# Patient Record
Sex: Female | Born: 1980 | Hispanic: No | Marital: Single | Smoking: Former smoker
Health system: Southern US, Community
[De-identification: ages and names within clinical notes are randomized; demographics above are authoritative.]

## PROBLEM LIST (undated history)

## (undated) ENCOUNTER — Inpatient Hospital Stay (HOSPITAL_COMMUNITY): Payer: Self-pay

## (undated) DIAGNOSIS — R87629 Unspecified abnormal cytological findings in specimens from vagina: Secondary | ICD-10-CM

## (undated) DIAGNOSIS — N83209 Unspecified ovarian cyst, unspecified side: Secondary | ICD-10-CM

---

## 2015-11-20 HISTORY — PX: ANTERIOR CRUCIATE LIGAMENT REPAIR: SHX115

## 2016-11-21 DIAGNOSIS — M25561 Pain in right knee: Secondary | ICD-10-CM | POA: Diagnosis not present

## 2016-11-21 DIAGNOSIS — R262 Difficulty in walking, not elsewhere classified: Secondary | ICD-10-CM | POA: Diagnosis not present

## 2016-11-21 DIAGNOSIS — S83511D Sprain of anterior cruciate ligament of right knee, subsequent encounter: Secondary | ICD-10-CM | POA: Diagnosis not present

## 2016-11-21 DIAGNOSIS — Z4789 Encounter for other orthopedic aftercare: Secondary | ICD-10-CM | POA: Diagnosis not present

## 2016-11-28 DIAGNOSIS — M25561 Pain in right knee: Secondary | ICD-10-CM | POA: Diagnosis not present

## 2016-11-28 DIAGNOSIS — R262 Difficulty in walking, not elsewhere classified: Secondary | ICD-10-CM | POA: Diagnosis not present

## 2016-11-28 DIAGNOSIS — S83511D Sprain of anterior cruciate ligament of right knee, subsequent encounter: Secondary | ICD-10-CM | POA: Diagnosis not present

## 2016-11-28 DIAGNOSIS — Z4789 Encounter for other orthopedic aftercare: Secondary | ICD-10-CM | POA: Diagnosis not present

## 2016-12-14 DIAGNOSIS — S83511D Sprain of anterior cruciate ligament of right knee, subsequent encounter: Secondary | ICD-10-CM | POA: Diagnosis not present

## 2016-12-14 DIAGNOSIS — Z4789 Encounter for other orthopedic aftercare: Secondary | ICD-10-CM | POA: Diagnosis not present

## 2016-12-14 DIAGNOSIS — M25561 Pain in right knee: Secondary | ICD-10-CM | POA: Diagnosis not present

## 2016-12-14 DIAGNOSIS — R262 Difficulty in walking, not elsewhere classified: Secondary | ICD-10-CM | POA: Diagnosis not present

## 2017-01-02 DIAGNOSIS — S83511D Sprain of anterior cruciate ligament of right knee, subsequent encounter: Secondary | ICD-10-CM | POA: Diagnosis not present

## 2017-01-02 DIAGNOSIS — Z4789 Encounter for other orthopedic aftercare: Secondary | ICD-10-CM | POA: Diagnosis not present

## 2017-02-08 DIAGNOSIS — Z3201 Encounter for pregnancy test, result positive: Secondary | ICD-10-CM | POA: Diagnosis not present

## 2017-02-11 DIAGNOSIS — Z4789 Encounter for other orthopedic aftercare: Secondary | ICD-10-CM | POA: Diagnosis not present

## 2017-02-11 DIAGNOSIS — S83511D Sprain of anterior cruciate ligament of right knee, subsequent encounter: Secondary | ICD-10-CM | POA: Diagnosis not present

## 2017-02-27 DIAGNOSIS — Z3201 Encounter for pregnancy test, result positive: Secondary | ICD-10-CM | POA: Diagnosis not present

## 2017-02-27 DIAGNOSIS — Z6826 Body mass index (BMI) 26.0-26.9, adult: Secondary | ICD-10-CM | POA: Diagnosis not present

## 2017-03-05 DIAGNOSIS — Z3689 Encounter for other specified antenatal screening: Secondary | ICD-10-CM | POA: Diagnosis not present

## 2017-03-05 DIAGNOSIS — Z3A09 9 weeks gestation of pregnancy: Secondary | ICD-10-CM | POA: Diagnosis not present

## 2017-03-05 DIAGNOSIS — O09521 Supervision of elderly multigravida, first trimester: Secondary | ICD-10-CM | POA: Diagnosis not present

## 2017-03-22 DIAGNOSIS — R87610 Atypical squamous cells of undetermined significance on cytologic smear of cervix (ASC-US): Secondary | ICD-10-CM | POA: Diagnosis not present

## 2017-03-22 DIAGNOSIS — Z113 Encounter for screening for infections with a predominantly sexual mode of transmission: Secondary | ICD-10-CM | POA: Diagnosis not present

## 2017-03-22 DIAGNOSIS — O09521 Supervision of elderly multigravida, first trimester: Secondary | ICD-10-CM | POA: Diagnosis not present

## 2017-03-22 DIAGNOSIS — Z3A11 11 weeks gestation of pregnancy: Secondary | ICD-10-CM | POA: Diagnosis not present

## 2017-04-19 DIAGNOSIS — Z3A15 15 weeks gestation of pregnancy: Secondary | ICD-10-CM | POA: Diagnosis not present

## 2017-04-19 DIAGNOSIS — O09522 Supervision of elderly multigravida, second trimester: Secondary | ICD-10-CM | POA: Diagnosis not present

## 2017-04-19 DIAGNOSIS — R87610 Atypical squamous cells of undetermined significance on cytologic smear of cervix (ASC-US): Secondary | ICD-10-CM | POA: Diagnosis not present

## 2017-05-14 DIAGNOSIS — Z3A19 19 weeks gestation of pregnancy: Secondary | ICD-10-CM | POA: Diagnosis not present

## 2017-05-14 DIAGNOSIS — O09522 Supervision of elderly multigravida, second trimester: Secondary | ICD-10-CM | POA: Diagnosis not present

## 2017-05-31 DIAGNOSIS — O925 Suppressed lactation: Secondary | ICD-10-CM | POA: Diagnosis not present

## 2017-06-13 DIAGNOSIS — O09522 Supervision of elderly multigravida, second trimester: Secondary | ICD-10-CM | POA: Diagnosis not present

## 2017-06-13 DIAGNOSIS — Z3A23 23 weeks gestation of pregnancy: Secondary | ICD-10-CM | POA: Diagnosis not present

## 2017-07-12 DIAGNOSIS — Z3689 Encounter for other specified antenatal screening: Secondary | ICD-10-CM | POA: Diagnosis not present

## 2017-07-12 DIAGNOSIS — Z3A27 27 weeks gestation of pregnancy: Secondary | ICD-10-CM | POA: Diagnosis not present

## 2017-07-12 DIAGNOSIS — O09522 Supervision of elderly multigravida, second trimester: Secondary | ICD-10-CM | POA: Diagnosis not present

## 2017-07-17 DIAGNOSIS — Z3689 Encounter for other specified antenatal screening: Secondary | ICD-10-CM | POA: Diagnosis not present

## 2017-07-17 DIAGNOSIS — Z3A28 28 weeks gestation of pregnancy: Secondary | ICD-10-CM | POA: Diagnosis not present

## 2017-07-17 DIAGNOSIS — O99810 Abnormal glucose complicating pregnancy: Secondary | ICD-10-CM | POA: Diagnosis not present

## 2017-07-30 DIAGNOSIS — Z23 Encounter for immunization: Secondary | ICD-10-CM | POA: Diagnosis not present

## 2017-07-30 DIAGNOSIS — O09523 Supervision of elderly multigravida, third trimester: Secondary | ICD-10-CM | POA: Diagnosis not present

## 2017-07-30 DIAGNOSIS — Z3A30 30 weeks gestation of pregnancy: Secondary | ICD-10-CM | POA: Diagnosis not present

## 2017-08-13 DIAGNOSIS — O09523 Supervision of elderly multigravida, third trimester: Secondary | ICD-10-CM | POA: Diagnosis not present

## 2017-08-13 DIAGNOSIS — Z3A32 32 weeks gestation of pregnancy: Secondary | ICD-10-CM | POA: Diagnosis not present

## 2017-08-20 ENCOUNTER — Other Ambulatory Visit: Payer: Self-pay | Admitting: Obstetrics and Gynecology

## 2017-08-30 DIAGNOSIS — O09523 Supervision of elderly multigravida, third trimester: Secondary | ICD-10-CM | POA: Diagnosis not present

## 2017-08-30 DIAGNOSIS — Z23 Encounter for immunization: Secondary | ICD-10-CM | POA: Diagnosis not present

## 2017-08-30 DIAGNOSIS — Z3A34 34 weeks gestation of pregnancy: Secondary | ICD-10-CM | POA: Diagnosis not present

## 2017-09-03 ENCOUNTER — Inpatient Hospital Stay (HOSPITAL_COMMUNITY)
Admission: AD | Admit: 2017-09-03 | Discharge: 2017-09-03 | Disposition: A | Discharge status: Home / Self Care | Payer: BLUE CROSS/BLUE SHIELD | Source: Ambulatory Visit | Attending: Obstetrics and Gynecology | Admitting: Obstetrics and Gynecology

## 2017-09-03 ENCOUNTER — Inpatient Hospital Stay (HOSPITAL_COMMUNITY): Payer: BLUE CROSS/BLUE SHIELD

## 2017-09-03 ENCOUNTER — Encounter (HOSPITAL_COMMUNITY): Payer: Self-pay | Admitting: *Deleted

## 2017-09-03 DIAGNOSIS — O26893 Other specified pregnancy related conditions, third trimester: Secondary | ICD-10-CM | POA: Insufficient documentation

## 2017-09-03 DIAGNOSIS — O368130 Decreased fetal movements, third trimester, not applicable or unspecified: Secondary | ICD-10-CM | POA: Diagnosis not present

## 2017-09-03 DIAGNOSIS — O4693 Antepartum hemorrhage, unspecified, third trimester: Secondary | ICD-10-CM | POA: Diagnosis present

## 2017-09-03 DIAGNOSIS — Z3A35 35 weeks gestation of pregnancy: Secondary | ICD-10-CM | POA: Diagnosis not present

## 2017-09-03 DIAGNOSIS — R030 Elevated blood-pressure reading, without diagnosis of hypertension: Secondary | ICD-10-CM | POA: Diagnosis not present

## 2017-09-03 DIAGNOSIS — O34211 Maternal care for low transverse scar from previous cesarean delivery: Secondary | ICD-10-CM | POA: Insufficient documentation

## 2017-09-03 DIAGNOSIS — O4703 False labor before 37 completed weeks of gestation, third trimester: Secondary | ICD-10-CM

## 2017-09-03 DIAGNOSIS — O09523 Supervision of elderly multigravida, third trimester: Secondary | ICD-10-CM | POA: Diagnosis not present

## 2017-09-03 DIAGNOSIS — Z87891 Personal history of nicotine dependence: Secondary | ICD-10-CM | POA: Diagnosis not present

## 2017-09-03 DIAGNOSIS — O34219 Maternal care for unspecified type scar from previous cesarean delivery: Secondary | ICD-10-CM | POA: Diagnosis present

## 2017-09-03 HISTORY — DX: Unspecified abnormal cytological findings in specimens from vagina: R87.629

## 2017-09-03 HISTORY — DX: Unspecified ovarian cyst, unspecified side: N83.209

## 2017-09-03 LAB — COMPREHENSIVE METABOLIC PANEL
ALK PHOS: 107 U/L (ref 38–126)
ALT: 18 U/L (ref 14–54)
AST: 23 U/L (ref 15–41)
Albumin: 3 g/dL — ABNORMAL LOW (ref 3.5–5.0)
Anion gap: 8 (ref 5–15)
BILIRUBIN TOTAL: 0.4 mg/dL (ref 0.3–1.2)
BUN: 7 mg/dL (ref 6–20)
CO2: 23 mmol/L (ref 22–32)
CREATININE: 0.61 mg/dL (ref 0.44–1.00)
Calcium: 8.9 mg/dL (ref 8.9–10.3)
Chloride: 105 mmol/L (ref 101–111)
GFR calc Af Amer: >60 mL/min (ref >60)
Glucose, Bld: 94 mg/dL (ref 65–99)
Potassium: 3.9 mmol/L (ref 3.5–5.1)
Sodium: 136 mmol/L (ref 135–145)
TOTAL PROTEIN: 6.7 g/dL (ref 6.5–8.1)

## 2017-09-03 LAB — URINALYSIS, ROUTINE W REFLEX MICROSCOPIC
Bilirubin Urine: NEGATIVE
GLUCOSE, UA: NEGATIVE mg/dL
Ketones, ur: NEGATIVE mg/dL
Leukocytes, UA: NEGATIVE
Nitrite: NEGATIVE
Protein, ur: NEGATIVE mg/dL
SPECIFIC GRAVITY, URINE: 1.004 — AB (ref 1.005–1.030)
pH: 7 (ref 5.0–8.0)

## 2017-09-03 LAB — CBC
HEMATOCRIT: 36.6 % (ref 36.0–46.0)
Hemoglobin: 12.6 g/dL (ref 12.0–15.0)
MCH: 31.7 pg (ref 26.0–34.0)
MCHC: 34.4 g/dL (ref 30.0–36.0)
MCV: 92.2 fL (ref 78.0–100.0)
Platelets: 190 10*3/uL (ref 150–400)
RBC: 3.97 MIL/uL (ref 3.87–5.11)
RDW: 13 % (ref 11.5–15.5)
WBC: 9.8 10*3/uL (ref 4.0–10.5)

## 2017-09-03 LAB — PROTEIN / CREATININE RATIO, URINE: CREATININE, URINE: 32 mg/dL

## 2017-09-03 LAB — URIC ACID: URIC ACID, SERUM: 4 mg/dL (ref 2.3–6.6)

## 2017-09-03 MED ORDER — BETAMETHASONE SOD PHOS & ACET 6 (3-3) MG/ML IJ SUSP
12.0000 mg | Freq: Once | INTRAMUSCULAR | Status: AC
  Administered 2017-09-03: 12 mg via INTRAMUSCULAR
  Filled 2017-09-03: qty 2

## 2017-09-03 NOTE — Discharge Instructions (Signed)
Labor precautions, call if increased vaginal bleeding, decreased fetal movement, rupture of membrane

## 2017-09-03 NOTE — MAU Note (Signed)
Saw some blood when used the restroom this morning. Denies any pain with urination.  Last had intercourse around midnight.

## 2017-09-03 NOTE — MAU Note (Signed)
BP elevated, states is very anxious

## 2017-09-03 NOTE — MAU Note (Signed)
A. Kennadi Albany, RN assume care of patient.  

## 2017-09-03 NOTE — MAU Provider Note (Signed)
History     CSN: [id number]  Arrival date and time: 09/03/17 4098   First Provider Initiated Contact with Patient 09/03/17 [phone]      Chief Complaint  Patient presents with  . Vaginal Bleeding   HPI  Jennifer Barrera is a G2P1 at 35+1 weeks presenting today for vaginal bleeding in third trimester, cramping, and decreased fetal movement.  She called the on call midwife at 6:20am with concerns and was advised to come to MAU.  She reports intercourse around midnight and woke up at 6am with brown, dark blood with wiping.  She states since the one incident, she has not seen any more blood.  She denies LOF, passing clots, or abnormal discharge.  She also reports having intermittent BH ctxs since yesterday, but has noticed an increase in cramping this morning.  She reports her baby usually moves during the late morning, and afternoon and has not felt her move as much, but does report a few rolls.  She is a prior LTCS for arrest of dilation and NRFHR.  She is scheduled for a repeat LTCS on 10/01/17.  On arrival to MAU, she was found to have serial elevated BPs.  She denies HA, visual changes, or epigastric pain.   OB History    Gravida Para Term Preterm AB Living   0 0 1   SAB TAB Ectopic Multiple Live Births   0 0 0 0 1      Past Medical History:  Diagnosis Date  . Ovarian cyst   . Vaginal Pap smear, abnormal    bx, ok since    Past Surgical History:  Procedure Laterality Date  . ANTERIOR CRUCIATE LIGAMENT REPAIR Right 2017  . CESAREAN SECTION      Family History  Problem Relation Age of Onset  . Alzheimer's disease Maternal Grandmother   . Cancer Paternal Grandmother        breast    Social History  Substance Use Topics  . Smoking status: Former Games developer  . Smokeless tobacco: Never Used     Comment: quit in early 20's  . Alcohol use No    Allergies: No Known Allergies  Prescriptions Prior to Admission  Medication Sig Dispense Refill Last Dose  . calcium carbonate  (TUMS - DOSED IN MG ELEMENTAL CALCIUM) 500 MG chewable tablet Chew 2 tablets by mouth 2 (two) times daily as needed for indigestion or heartburn.   09/02/2017 at Unknown time  . Prenatal Vit-Fe Fumarate-FA (PRENATAL MULTIVITAMIN) TABS tablet Take 1 tablet by mouth daily at 12 noon.   09/02/2017 at Unknown time    Review of Systems  Constitutional: Negative.  Negative for chills and fever.  HENT: Negative.   Eyes: Negative.  Negative for photophobia and visual disturbance.  Respiratory: Negative.   Gastrointestinal: Negative.   Endocrine: Negative.   Genitourinary: Positive for vaginal bleeding. Negative for pelvic pain, urgency, vaginal discharge and vaginal pain.  Musculoskeletal: Negative.   Skin: Negative.   Neurological: Negative.  Negative for headaches.  Psychiatric/Behavioral: Negative.    Physical Exam   Vitals:   09/03/17 0915 09/03/17 0930 09/03/17 1135 09/03/17 1240  BP: 126/89 121/75 125/89 134/80  Pulse: 92 95 91 97  Resp:   17   Temp:   99.1 F (37.3 C)   TempSrc:   Oral   SpO2:      Weight:      Height:       Blood pressure 121/75, pulse 95, temperature 98.4 F (  36.9 C), temperature source Oral, resp. rate 18, height  (1.626 m), weight 75.9 kg (167 lb 4 oz), SpO2 100 %.  Physical Exam  Constitutional: She is oriented to person, place, and time. She appears well-developed and well-nourished.  GI: Soft. There is no tenderness.  Genitourinary:  Genitourinary Comments: Uterus: gravid, non-tender Sterile speculum exam: scant pink/red bleeding noted on large Q-tip swab SVE: closed/50-60%/-1/vtx  Neurological: She is alert and oriented to person, place, and time.  Skin: Skin is warm and dry.  Psychiatric: She has a normal mood and affect.   Fetal monitoring:  Baseline: 145 bpm/ minimal-moderate variability/ +accels / occasional variable deceleration Toco: ctxs every 3-5 minutes/ mild  Results for orders placed or performed during the hospital encounter of  09/03/17 (from the past 24 hour(s))  Urinalysis, Routine w reflex microscopic     Status: Abnormal   Collection Time: 09/03/17  7:52 AM  Result Value Ref Range   Color, Urine STRAW (A) YELLOW   APPearance CLEAR CLEAR   Specific Gravity, Urine 1.004 (L) 1.005 - 1.030   pH 7.0 5.0 - 8.0   Glucose, UA NEGATIVE NEGATIVE mg/dL   Hgb urine dipstick LARGE (A) NEGATIVE   Bilirubin Urine NEGATIVE NEGATIVE   Ketones, ur NEGATIVE NEGATIVE mg/dL   Protein, ur NEGATIVE NEGATIVE mg/dL   Nitrite NEGATIVE NEGATIVE   Leukocytes, UA NEGATIVE NEGATIVE   RBC / HPF 0-5 0 - 5 RBC/hpf   WBC, UA 0-5 0 - 5 WBC/hpf   Bacteria, UA RARE (A) NONE SEEN   Squamous Epithelial / LPF 0-5 (A) NONE SEEN  Protein / creatinine ratio, urine     Status: None   Collection Time: 09/03/17  8:20 AM  Result Value Ref Range   Creatinine, Urine 32.00 mg/dL   Total Protein, Urine <6.0 mg/dL   Protein Creatinine Ratio        0.00 - 0.15 mg/mg[Cre]  CBC     Status: None   Collection Time: 09/03/17  8:44 AM  Result Value Ref Range   WBC 9.8 4.0 - 10.5 K/uL   RBC 3.97 3.87 - 5.11 MIL/uL   Hemoglobin 12.6 12.0 - 15.0 g/dL   HCT 40.9 81.1 - 91.4 %   MCV 92.2 78.0 - 100.0 fL   MCH 31.7 26.0 - 34.0 pg   MCHC 34.4 30.0 - 36.0 g/dL   RDW 78.2 95.6 - 21.3 %   Platelets 190 150 - 400 K/uL  Comprehensive metabolic panel     Status: Abnormal   Collection Time: 09/03/17  8:44 AM  Result Value Ref Range   Sodium 136 135 - 145 mmol/L   Potassium 3.9 3.5 - 5.1 mmol/L   Chloride 105 101 - 111 mmol/L   CO2 23 22 - 32 mmol/L   Glucose, Bld 94 65 - 99 mg/dL   BUN 7 6 - 20 mg/dL   Creatinine, Ser 0.86 0.44 - 1.00 mg/dL   Calcium 8.9 8.9 - 57.8 mg/dL   Total Protein 6.7 6.5 - 8.1 g/dL   Albumin 3.0 (L) 3.5 - 5.0 g/dL   AST 23 15 - 41 U/L   ALT 18 14 - 54 U/L   Alkaline Phosphatase 107 38 - 126 U/L   Total Bilirubin 0.4 0.3 - 1.2 mg/dL   GFR calc non Af Amer >60 >60 mL/min   GFR calc Af Amer >60 >60 mL/min   Anion gap 8 5 - 15   Uric acid     Status: None  Collection Time: 09/03/17  8:44 AM  Result Value Ref Range   Uric Acid, Serum 4.0 2.3 - 6.6 mg/dL  Indications   [redacted] weeks gestation of pregnancy                Z3A.37   Advanced maternal age multigravida 14+,        O84.523   third trimester   Vaginal bleeding in pregnancy, third trimester O46.93   Previous cesarean delivery, antepartum         O34.219  ---------------------------------------------------------------------- OB History  Gravidity:    2         Term:   1        Prem:   0        SAB:   0  TOP:          0       Ectopic:  0        Living: 1 ---------------------------------------------------------------------- Fetal Evaluation  Num Of Fetuses:     1  Fetal Heart         153  Rate(bpm):  Cardiac Activity:   Observed  Presentation:       Cephalic  Placenta:           Posterior Fundal, above cervical os  P. Cord Insertion:  Visualized, central  Amniotic Fluid  AFI FV:      Subjectively within normal limits  AFI Sum(cm)     %Tile       Largest Pocket(cm)  13.27           45          4.89  RUQ(cm)       RLQ(cm)       LUQ(cm)        LLQ(cm)  3.56          4.89          2.21           2.61 ---------------------------------------------------------------------- Biophysical Evaluation  Amniotic F.V:   Within normal limits       F. Tone:        Observed  F. Movement:    Observed                   Score:          8/8  F. Breathing:   Observed ---------------------------------------------------------------------- Gestational Age  Clinical EDD:  35w 1d                                        EDD:   10/07/17  Best:          35w 1d     Det. By:  Clinical EDD             EDD:   10/07/17 ---------------------------------------------------------------------- Cervix Uterus Adnexa  Cervix  Not visualized (advanced GA >29wks) ---------------------------------------------------------------------- Impression  IUP at 35+1 with vaginal bleeding  Normal  fetal movement and cardiac activity  Normal placentation without previa or markers for abruption  Normal amniotic fluid  BPP 8/8 ---------------------------------------------------------------------- Recommendations  Continue clinical evaluation and management . MAU Course  Procedures - Fetal monitoring - BPP - OB limited sono - GBS - BMZ x 1 Assessment and Plan  1. Single IUP, previous C/S at 35+1 with vaginal bleeding     - Continuous fetal monitoring     - Likely related  to postcoital bleeding     - OB limited US to r/o placental abruption 2. Category 2 Fetal monitoring      - BPP now  3. Transient elevated BPs     - PIH labs  4. Preterm uterine contractions      - Oral hydration      - No cervical change at this time/ continue to monitor   Consult for plan of care: Dr. Laurence Aly C Indra Wolters 09/03/2017, 10:12 AM   Reassessment at 12:00pm Fetal monitoring:  Baseline; 145 bpm / moderate variability/ +accels/ no decels  1. Single IUP, previous C/S at 35+1 with vaginal bleeding     - Continuous fetal monitoring     - Likely related to postcoital bleeding     - OB limited US to r/o placental abruption - WNL; no evidence of abruption 2. Category 1 Fetal monitoring      - BPP 8/8 3. Transient elevated BPs likely related to anxiety       - PIH labs WNL     - BPs normalized  4. Preterm uterine contractions      - Oral hydration      - BMZ x1     - GBS swab now 5. D/C home with precautions      - Bleeding precautions reviewed     - Strict FKC's daily     - PTL precautions reviewed      - Schedule appt in office tomorrow for 2nd dose of BMZ and f/u   Consult for plan: Dr. Nelle Don, [facility]

## 2017-09-04 DIAGNOSIS — Z3A35 35 weeks gestation of pregnancy: Secondary | ICD-10-CM | POA: Diagnosis not present

## 2017-09-05 LAB — CULTURE, BETA STREP (GROUP B ONLY)

## 2017-09-13 DIAGNOSIS — Z3A36 36 weeks gestation of pregnancy: Secondary | ICD-10-CM | POA: Diagnosis not present

## 2017-09-13 DIAGNOSIS — O133 Gestational [pregnancy-induced] hypertension without significant proteinuria, third trimester: Secondary | ICD-10-CM | POA: Diagnosis not present

## 2017-09-19 ENCOUNTER — Inpatient Hospital Stay (HOSPITAL_COMMUNITY): Payer: BLUE CROSS/BLUE SHIELD | Admitting: Anesthesiology

## 2017-09-19 ENCOUNTER — Encounter (HOSPITAL_COMMUNITY): Payer: Self-pay

## 2017-09-19 ENCOUNTER — Inpatient Hospital Stay (HOSPITAL_COMMUNITY)
Admission: AD | Admit: 2017-09-19 | Discharge: 2017-09-22 | DRG: 788 | Disposition: A | Discharge status: Home / Self Care | Payer: BLUE CROSS/BLUE SHIELD | Source: Ambulatory Visit | Attending: Obstetrics and Gynecology | Admitting: Obstetrics and Gynecology

## 2017-09-19 ENCOUNTER — Encounter (HOSPITAL_COMMUNITY)
Admission: AD | Disposition: A | Discharge status: Home / Self Care | Payer: Self-pay | Source: Ambulatory Visit | Attending: Obstetrics and Gynecology

## 2017-09-19 DIAGNOSIS — D649 Anemia, unspecified: Secondary | ICD-10-CM | POA: Diagnosis present

## 2017-09-19 DIAGNOSIS — Z87891 Personal history of nicotine dependence: Secondary | ICD-10-CM | POA: Diagnosis not present

## 2017-09-19 DIAGNOSIS — Z3A37 37 weeks gestation of pregnancy: Secondary | ICD-10-CM

## 2017-09-19 DIAGNOSIS — O9902 Anemia complicating childbirth: Secondary | ICD-10-CM | POA: Diagnosis present

## 2017-09-19 DIAGNOSIS — O34211 Maternal care for low transverse scar from previous cesarean delivery: Secondary | ICD-10-CM | POA: Diagnosis not present

## 2017-09-19 DIAGNOSIS — O4693 Antepartum hemorrhage, unspecified, third trimester: Secondary | ICD-10-CM

## 2017-09-19 DIAGNOSIS — O34219 Maternal care for unspecified type scar from previous cesarean delivery: Secondary | ICD-10-CM | POA: Diagnosis not present

## 2017-09-19 DIAGNOSIS — O134 Gestational [pregnancy-induced] hypertension without significant proteinuria, complicating childbirth: Secondary | ICD-10-CM | POA: Diagnosis not present

## 2017-09-19 LAB — CBC
HEMATOCRIT: 36.9 % (ref 36.0–46.0)
HEMOGLOBIN: 12.4 g/dL (ref 12.0–15.0)
MCH: 30.8 pg (ref 26.0–34.0)
MCHC: 33.6 g/dL (ref 30.0–36.0)
MCV: 91.6 fL (ref 78.0–100.0)
Platelets: 183 10*3/uL (ref 150–400)
RBC: 4.03 MIL/uL (ref 3.87–5.11)
RDW: 13.6 % (ref 11.5–15.5)
WBC: 14.2 10*3/uL — ABNORMAL HIGH (ref 4.0–10.5)

## 2017-09-19 LAB — TYPE AND SCREEN
ABO/RH(D): O POS
Antibody Screen: NEGATIVE

## 2017-09-19 LAB — ABO/RH: ABO/RH(D): O POS

## 2017-09-19 LAB — RPR: RPR Ser Ql: NONREACTIVE

## 2017-09-19 SURGERY — Surgical Case
Anesthesia: Spinal

## 2017-09-19 MED ORDER — NALOXONE HCL 0.4 MG/ML IJ SOLN: 0.4000 mg | INTRAMUSCULAR | Status: DC | PRN

## 2017-09-19 MED ORDER — PROMETHAZINE HCL 25 MG/ML IJ SOLN: 6.2500 mg | INTRAMUSCULAR | Status: DC | PRN

## 2017-09-19 MED ORDER — DEXAMETHASONE SODIUM PHOSPHATE 4 MG/ML IJ SOLN
INTRAMUSCULAR | Status: DC | PRN
  Administered 2017-09-19: 4 mg via INTRAVENOUS

## 2017-09-19 MED ORDER — SIMETHICONE 80 MG PO CHEW
80.0000 mg | CHEWABLE_TABLET | Freq: Three times a day (TID) | ORAL | Status: DC
  Administered 2017-09-19 – 2017-09-22 (×9): 80 mg via ORAL
  Filled 2017-09-19 (×9): qty 1

## 2017-09-19 MED ORDER — SCOPOLAMINE 1 MG/3DAYS TD PT72
MEDICATED_PATCH | TRANSDERMAL | Status: AC
  Filled 2017-09-19: qty 1

## 2017-09-19 MED ORDER — SCOPOLAMINE 1 MG/3DAYS TD PT72
MEDICATED_PATCH | TRANSDERMAL | Status: DC | PRN
  Administered 2017-09-19: 1 via TRANSDERMAL

## 2017-09-19 MED ORDER — NALBUPHINE HCL 10 MG/ML IJ SOLN: 5.0000 mg | Freq: Once | INTRAMUSCULAR | Status: DC | PRN

## 2017-09-19 MED ORDER — BUPIVACAINE IN DEXTROSE 0.75-8.25 % IT SOLN
INTRATHECAL | Status: DC | PRN
  Administered 2017-09-19: 1.4 mL via INTRATHECAL

## 2017-09-19 MED ORDER — CEFAZOLIN SODIUM-DEXTROSE 2-4 GM/100ML-% IV SOLN
2.0000 g | INTRAVENOUS | Status: AC
  Administered 2017-09-19: 2 g via INTRAVENOUS

## 2017-09-19 MED ORDER — LACTATED RINGERS IV SOLN
INTRAVENOUS | Status: DC
  Administered 2017-09-19 (×2): via INTRAVENOUS

## 2017-09-19 MED ORDER — DIPHENHYDRAMINE HCL 25 MG PO CAPS: 25.0000 mg | ORAL_CAPSULE | Freq: Four times a day (QID) | ORAL | Status: DC | PRN

## 2017-09-19 MED ORDER — OXYCODONE-ACETAMINOPHEN 5-325 MG PO TABS: 2.0000 | ORAL_TABLET | ORAL | Status: DC | PRN

## 2017-09-19 MED ORDER — SCOPOLAMINE 1 MG/3DAYS TD PT72
1.0000 | MEDICATED_PATCH | Freq: Once | TRANSDERMAL | Status: DC
  Filled 2017-09-19: qty 1

## 2017-09-19 MED ORDER — BUPIVACAINE HCL (PF) 0.25 % IJ SOLN
INTRAMUSCULAR | Status: AC
  Filled 2017-09-19: qty 30

## 2017-09-19 MED ORDER — KETOROLAC TROMETHAMINE 30 MG/ML IJ SOLN
30.0000 mg | Freq: Four times a day (QID) | INTRAMUSCULAR | Status: AC | PRN
  Administered 2017-09-19: 30 mg via INTRAMUSCULAR

## 2017-09-19 MED ORDER — PHENYLEPHRINE 8 MG IN D5W 100 ML (0.08MG/ML) PREMIX OPTIME
INJECTION | INTRAVENOUS | Status: AC
  Filled 2017-09-19: qty 100

## 2017-09-19 MED ORDER — CEFAZOLIN SODIUM-DEXTROSE 2-4 GM/100ML-% IV SOLN: 2.0000 g | INTRAVENOUS | Status: DC

## 2017-09-19 MED ORDER — FAMOTIDINE IN NACL 20-0.9 MG/50ML-% IV SOLN
20.0000 mg | Freq: Once | INTRAVENOUS | Status: AC
  Administered 2017-09-19: 20 mg via INTRAVENOUS
  Filled 2017-09-19: qty 50

## 2017-09-19 MED ORDER — ONDANSETRON HCL 4 MG/2ML IJ SOLN: 4.0000 mg | Freq: Three times a day (TID) | INTRAMUSCULAR | Status: DC | PRN

## 2017-09-19 MED ORDER — IBUPROFEN 600 MG PO TABS
600.0000 mg | ORAL_TABLET | Freq: Four times a day (QID) | ORAL | Status: DC
  Administered 2017-09-19 – 2017-09-22 (×13): 600 mg via ORAL
  Filled 2017-09-19 (×13): qty 1

## 2017-09-19 MED ORDER — SIMETHICONE 80 MG PO CHEW: 80.0000 mg | CHEWABLE_TABLET | ORAL | Status: DC | PRN

## 2017-09-19 MED ORDER — MEPERIDINE HCL 25 MG/ML IJ SOLN
INTRAMUSCULAR | Status: AC
  Filled 2017-09-19: qty 1

## 2017-09-19 MED ORDER — LACTATED RINGERS IV SOLN: INTRAVENOUS | Status: DC

## 2017-09-19 MED ORDER — MEPERIDINE HCL 25 MG/ML IJ SOLN
INTRAMUSCULAR | Status: DC | PRN
  Administered 2017-09-19: 12.5 mg via INTRAVENOUS

## 2017-09-19 MED ORDER — DIPHENHYDRAMINE HCL 50 MG/ML IJ SOLN: 12.5000 mg | INTRAMUSCULAR | Status: DC | PRN

## 2017-09-19 MED ORDER — FENTANYL CITRATE (PF) 100 MCG/2ML IJ SOLN
INTRAMUSCULAR | Status: DC | PRN
  Administered 2017-09-19: 20 ug via INTRATHECAL

## 2017-09-19 MED ORDER — ONDANSETRON HCL 4 MG/2ML IJ SOLN
INTRAMUSCULAR | Status: AC
  Filled 2017-09-19: qty 2

## 2017-09-19 MED ORDER — BUPIVACAINE IN DEXTROSE 0.75-8.25 % IT SOLN
INTRATHECAL | Status: AC
  Filled 2017-09-19: qty 2

## 2017-09-19 MED ORDER — SENNOSIDES-DOCUSATE SODIUM 8.6-50 MG PO TABS
2.0000 | ORAL_TABLET | ORAL | Status: DC
  Administered 2017-09-20 – 2017-09-21 (×3): 2 via ORAL
  Filled 2017-09-19 (×3): qty 2

## 2017-09-19 MED ORDER — ZOLPIDEM TARTRATE 5 MG PO TABS: 5.0000 mg | ORAL_TABLET | Freq: Every evening | ORAL | Status: DC | PRN

## 2017-09-19 MED ORDER — ONDANSETRON HCL 4 MG/2ML IJ SOLN
INTRAMUSCULAR | Status: DC | PRN
  Administered 2017-09-19: 4 mg via INTRAVENOUS

## 2017-09-19 MED ORDER — SODIUM CHLORIDE 0.9 % IR SOLN
Status: DC | PRN
  Administered 2017-09-19: 1

## 2017-09-19 MED ORDER — OXYTOCIN 10 UNIT/ML IJ SOLN
INTRAMUSCULAR | Status: DC | PRN
  Administered 2017-09-19: 40 [IU] via INTRAVENOUS

## 2017-09-19 MED ORDER — KETOROLAC TROMETHAMINE 30 MG/ML IJ SOLN: 30.0000 mg | Freq: Once | INTRAMUSCULAR | Status: DC | PRN

## 2017-09-19 MED ORDER — NALOXONE HCL 2 MG/2ML IJ SOSY
1.0000 ug/kg/h | PREFILLED_SYRINGE | INTRAMUSCULAR | Status: DC | PRN
  Filled 2017-09-19: qty 2

## 2017-09-19 MED ORDER — MEPERIDINE HCL 25 MG/ML IJ SOLN: 6.2500 mg | INTRAMUSCULAR | Status: DC | PRN

## 2017-09-19 MED ORDER — SOD CITRATE-CITRIC ACID 500-334 MG/5ML PO SOLN
30.0000 mL | Freq: Once | ORAL | Status: AC
  Administered 2017-09-19: 30 mL via ORAL
  Filled 2017-09-19: qty 15

## 2017-09-19 MED ORDER — DEXAMETHASONE SODIUM PHOSPHATE 4 MG/ML IJ SOLN
INTRAMUSCULAR | Status: AC
Start: 2017-09-19 — End: 2017-09-19
  Filled 2017-09-19: qty 1

## 2017-09-19 MED ORDER — DIPHENHYDRAMINE HCL 25 MG PO CAPS: 25.0000 mg | ORAL_CAPSULE | ORAL | Status: DC | PRN

## 2017-09-19 MED ORDER — KETOROLAC TROMETHAMINE 30 MG/ML IJ SOLN: 30.0000 mg | Freq: Four times a day (QID) | INTRAMUSCULAR | Status: AC | PRN

## 2017-09-19 MED ORDER — LACTATED RINGERS IV SOLN
INTRAVENOUS | Status: DC
  Administered 2017-09-19: 12:00:00 via INTRAVENOUS

## 2017-09-19 MED ORDER — PRENATAL MULTIVITAMIN CH
1.0000 | ORAL_TABLET | Freq: Every day | ORAL | Status: DC
  Administered 2017-09-19 – 2017-09-22 (×4): 1 via ORAL
  Filled 2017-09-19 (×5): qty 1

## 2017-09-19 MED ORDER — OXYCODONE-ACETAMINOPHEN 5-325 MG PO TABS
1.0000 | ORAL_TABLET | ORAL | Status: DC | PRN
  Administered 2017-09-20: 1 via ORAL
  Filled 2017-09-19 (×2): qty 1

## 2017-09-19 MED ORDER — SIMETHICONE 80 MG PO CHEW
80.0000 mg | CHEWABLE_TABLET | ORAL | Status: DC
  Administered 2017-09-20 – 2017-09-21 (×3): 80 mg via ORAL
  Filled 2017-09-19 (×4): qty 1

## 2017-09-19 MED ORDER — SODIUM CHLORIDE 0.9% FLUSH: 3.0000 mL | INTRAVENOUS | Status: DC | PRN

## 2017-09-19 MED ORDER — ACETAMINOPHEN 500 MG PO TABS
1000.0000 mg | ORAL_TABLET | Freq: Four times a day (QID) | ORAL | Status: AC
  Administered 2017-09-19 – 2017-09-20 (×3): 1000 mg via ORAL
  Filled 2017-09-19 (×3): qty 2

## 2017-09-19 MED ORDER — OXYTOCIN 10 UNIT/ML IJ SOLN
INTRAMUSCULAR | Status: AC
  Filled 2017-09-19: qty 4

## 2017-09-19 MED ORDER — DEXAMETHASONE SODIUM PHOSPHATE 4 MG/ML IJ SOLN
INTRAMUSCULAR | Status: AC
  Filled 2017-09-19: qty 1

## 2017-09-19 MED ORDER — KETOROLAC TROMETHAMINE 30 MG/ML IJ SOLN
INTRAMUSCULAR | Status: AC
  Administered 2017-09-19: 30 mg via INTRAMUSCULAR
  Filled 2017-09-19: qty 1

## 2017-09-19 MED ORDER — BUPIVACAINE HCL (PF) 0.25 % IJ SOLN
INTRAMUSCULAR | Status: DC | PRN
  Administered 2017-09-19: 30 mL

## 2017-09-19 MED ORDER — DIBUCAINE 1 % RE OINT: 1.0000 "application " | TOPICAL_OINTMENT | RECTAL | Status: DC | PRN

## 2017-09-19 MED ORDER — NALBUPHINE HCL 10 MG/ML IJ SOLN: 5.0000 mg | INTRAMUSCULAR | Status: DC | PRN

## 2017-09-19 MED ORDER — HYDROMORPHONE HCL 1 MG/ML IJ SOLN: 0.2500 mg | INTRAMUSCULAR | Status: DC | PRN

## 2017-09-19 MED ORDER — MORPHINE SULFATE (PF) 0.5 MG/ML IJ SOLN
INTRAMUSCULAR | Status: DC | PRN
  Administered 2017-09-19: .2 mg via INTRATHECAL

## 2017-09-19 MED ORDER — FENTANYL CITRATE (PF) 100 MCG/2ML IJ SOLN
INTRAMUSCULAR | Status: AC
  Filled 2017-09-19: qty 2

## 2017-09-19 MED ORDER — MORPHINE SULFATE (PF) 0.5 MG/ML IJ SOLN
INTRAMUSCULAR | Status: AC
  Filled 2017-09-19: qty 10

## 2017-09-19 MED ORDER — OXYTOCIN 40 UNITS IN LACTATED RINGERS INFUSION - SIMPLE MED: 2.5000 [IU]/h | INTRAVENOUS | Status: AC

## 2017-09-19 MED ORDER — COCONUT OIL OIL
1.0000 "application " | TOPICAL_OIL | Status: DC | PRN
  Administered 2017-09-20: 1 via TOPICAL
  Filled 2017-09-19: qty 120

## 2017-09-19 MED ORDER — MENTHOL 3 MG MT LOZG: 1.0000 | LOZENGE | OROMUCOSAL | Status: DC | PRN

## 2017-09-19 MED ORDER — WITCH HAZEL-GLYCERIN EX PADS: 1.0000 "application " | MEDICATED_PAD | CUTANEOUS | Status: DC | PRN

## 2017-09-19 MED ORDER — PHENYLEPHRINE 8 MG IN D5W 100 ML (0.08MG/ML) PREMIX OPTIME
INJECTION | INTRAVENOUS | Status: DC | PRN
  Administered 2017-09-19: 60 ug/min via INTRAVENOUS

## 2017-09-19 SURGICAL SUPPLY — 43 items
BARRIER ADHS 3X4 INTERCEED (GAUZE/BANDAGES/DRESSINGS) ×2 IMPLANT
BENZOIN TINCTURE PRP APPL 2/3 (GAUZE/BANDAGES/DRESSINGS) ×2 IMPLANT
CHLORAPREP W/TINT 26ML (MISCELLANEOUS) ×2 IMPLANT
CLAMP CORD UMBIL (MISCELLANEOUS) IMPLANT
CLOSURE STERI STRIP 1/2 X4 (GAUZE/BANDAGES/DRESSINGS) ×2 IMPLANT
CLOTH BEACON ORANGE TIMEOUT ST (SAFETY) ×2 IMPLANT
CONTAINER PREFILL 10% NBF 15ML (MISCELLANEOUS) IMPLANT
DRAPE C SECTION CLR SCREEN (DRAPES) ×2 IMPLANT
DRSG OPSITE POSTOP 4X10 (GAUZE/BANDAGES/DRESSINGS) ×2 IMPLANT
ELECT REM PT RETURN 9FT ADLT (ELECTROSURGICAL) ×2
ELECTRODE REM PT RTRN 9FT ADLT (ELECTROSURGICAL) ×1 IMPLANT
EXTRACTOR VACUUM M CUP 4 TUBE (SUCTIONS) IMPLANT
GLOVE BIOGEL PI IND STRL 7.0 (GLOVE) ×2 IMPLANT
GLOVE BIOGEL PI INDICATOR 7.0 (GLOVE) ×2
GLOVE ECLIPSE 6.5 STRL STRAW (GLOVE) ×2 IMPLANT
GOWN STRL REUS W/TWL LRG LVL3 (GOWN DISPOSABLE) ×4 IMPLANT
KIT ABG SYR 3ML LUER SLIP (SYRINGE) IMPLANT
NEEDLE HYPO 22GX1.5 SAFETY (NEEDLE) ×2 IMPLANT
NEEDLE HYPO 25X5/8 SAFETYGLIDE (NEEDLE) IMPLANT
NS IRRIG 1000ML POUR BTL (IV SOLUTION) ×2 IMPLANT
PACK C SECTION WH (CUSTOM PROCEDURE TRAY) ×2 IMPLANT
PAD OB MATERNITY 4.3X12.25 (PERSONAL CARE ITEMS) ×2 IMPLANT
RTRCTR C-SECT PINK 25CM LRG (MISCELLANEOUS) IMPLANT
STRIP CLOSURE SKIN 1/2X4 (GAUZE/BANDAGES/DRESSINGS) IMPLANT
SUT CHROMIC GUT AB #0 18 (SUTURE) IMPLANT
SUT MNCRL 0 VIOLET CTX 36 (SUTURE) ×3 IMPLANT
SUT MON AB 2-0 SH 27 (SUTURE)
SUT MON AB 2-0 SH27 (SUTURE) IMPLANT
SUT MON AB 3-0 SH 27 (SUTURE)
SUT MON AB 3-0 SH27 (SUTURE) IMPLANT
SUT MON AB 4-0 PS1 27 (SUTURE) IMPLANT
SUT MONOCRYL 0 CTX 36 (SUTURE) ×3
SUT PLAIN 2 0 (SUTURE) ×1
SUT PLAIN 2 0 XLH (SUTURE) IMPLANT
SUT PLAIN ABS 2-0 CT1 27XMFL (SUTURE) ×1 IMPLANT
SUT VIC AB 0 CT1 36 (SUTURE) ×6 IMPLANT
SUT VIC AB 2-0 CT1 27 (SUTURE) ×1
SUT VIC AB 2-0 CT1 TAPERPNT 27 (SUTURE) ×1 IMPLANT
SUT VIC AB 4-0 KS 27 (SUTURE) ×2 IMPLANT
SUT VIC AB 4-0 PS2 27 (SUTURE) IMPLANT
SYR CONTROL 10ML LL (SYRINGE) ×2 IMPLANT
TOWEL OR 17X24 6PK STRL BLUE (TOWEL DISPOSABLE) ×2 IMPLANT
TRAY FOLEY BAG SILVER LF 14FR (SET/KITS/TRAYS/PACK) IMPLANT

## 2017-09-19 NOTE — Brief Op Note (Signed)
09/19/2017  4:40 AM  PATIENT:  Jennifer HearingMelissa Barrera  36 y.o. female  PRE-OPERATIVE DIAGNOSIS:  Latent labor, previous cesarean section, IUP @ 37 3/7 week  POST-OPERATIVE DIAGNOSIS:  same  PROCEDURE:  Repeat Cesarean section, kerr hysterotomy  SURGEON:  Surgeon(s) and Role:    * Maxie Betterousins, Berneta Sconyers, MD - Primary  PHYSICIAN ASSISTANT:   ASSISTANTS: Arlan Organaniela Paul, CNM   ANESTHESIA:   spinal Findings: live female, nl tubes and ovaries, placenta manually removed EBL:  650 mL   BLOOD ADMINISTERED:none  DRAINS: none   LOCAL MEDICATIONS USED:  MARCAINE     SPECIMEN:  Source of Specimen:  placenta  DISPOSITION OF SPECIMEN:  N/A  COUNTS:  YES  TOURNIQUET:  * No tourniquets in log *  DICTATION: .Other Dictation: Dictation Number [id number]  PLAN OF CARE: Admit to inpatient   PATIENT DISPOSITION:  PACU - hemodynamically stable.   Delay start of Pharmacological VTE agent (>24hrs) due to surgical blood loss or risk of bleeding: no

## 2017-09-19 NOTE — Anesthesia Postprocedure Evaluation (Signed)
Anesthesia Post Note  Patient: Jennifer Barrera  Procedure(s) Performed: CESAREAN SECTION (N/A )     Patient location during evaluation: PACU Anesthesia Type: Spinal Level of consciousness: awake Pain management: pain level controlled Vital Signs Assessment: post-procedure vital signs reviewed and stable Respiratory status: spontaneous breathing Cardiovascular status: stable Postop Assessment: no headache, no backache, spinal receding, patient able to bend at knees and no apparent nausea or vomiting Anesthetic complications: no    Last Vitals:  Vitals:   09/19/17 0500 09/19/17 0515  BP: (!) 142/84 130/77  Pulse: (!) 110 (!) 113  Resp: 19 (!) 21  Temp:    SpO2: 97% 97%    Last Pain:  Vitals:   09/19/17 0515  TempSrc:   PainSc: 0-No pain   Pain Goal:                 Lynore Coscia JR,JOHN Lillianah Swartzentruber

## 2017-09-19 NOTE — Anesthesia Preprocedure Evaluation (Signed)
Anesthesia Evaluation  Patient identified by MRN, date of birth, ID band Patient awake    Reviewed: Allergy & Precautions, H&P , NPO status , Patient's Chart, lab work & pertinent test results  Airway Mallampati: I  TM Distance: >3 FB Neck ROM: full    Dental no notable dental hx. (+) Teeth Intact   Pulmonary neg pulmonary ROS, former smoker,    Pulmonary exam normal breath sounds clear to auscultation       Cardiovascular negative cardio ROS Normal cardiovascular exam Rhythm:regular Rate:Normal     Neuro/Psych negative neurological ROS  negative psych ROS   GI/Hepatic negative GI ROS, Neg liver ROS,   Endo/Other  negative endocrine ROS  Renal/GU negative Renal ROS     Musculoskeletal negative musculoskeletal ROS (+)   Abdominal (+) + obese,   Peds  Hematology negative hematology ROS (+)   Anesthesia Other Findings   Reproductive/Obstetrics (+) Pregnancy                             Anesthesia Physical Anesthesia Plan  ASA: II  Anesthesia Plan: Spinal   Post-op Pain Management:    Induction:   PONV Risk Score and Plan:   Airway Management Planned:   Additional Equipment:   Intra-op Plan:   Post-operative Plan:   Informed Consent: I have reviewed the patients History and Physical, chart, labs and discussed the procedure including the risks, benefits and alternatives for the proposed anesthesia with the patient or authorized representative who has indicated his/her understanding and acceptance.     Plan Discussed with: CRNA and Surgeon  Anesthesia Plan Comments:         Anesthesia Quick Evaluation

## 2017-09-19 NOTE — Plan of Care (Signed)
Problem: Activity: Goal: Will verbalize the importance of balancing activity with adequate rest periods Outcome: Progressing Encouraged patient to ambulate at least three times daily in hallway.  Also that ambulation will help with gas pain.

## 2017-09-19 NOTE — MAU Note (Signed)
Pt reports contractions every 3 mins. Denies LOF or vaginal bleeding. Reports good fetal movement. Was closed last week

## 2017-09-19 NOTE — Addendum Note (Signed)
Addendum  created 09/19/17 0805 by Earmon PhoenixWilkerson, Jennessa Trigo P, CRNA   Sign clinical note

## 2017-09-19 NOTE — H&P (Signed)
Jennifer HearingMelissa Geisen is a 36 y.o. female hx previous C/S presenting @ 37 3/7 weeks with c/o ctxs since 1 am. Intact membrane. GBS cx neg. Pr requesting her repeat C/S  OB History    Gravida Para Term Preterm AB Living   2 1 1  0 0 1   SAB TAB Ectopic Multiple Live Births   0 0 0 0 1     Past Medical History:  Diagnosis Date  . Ovarian cyst   . Vaginal Pap smear, abnormal    bx, ok since   Past Surgical History:  Procedure Laterality Date  . ANTERIOR CRUCIATE LIGAMENT REPAIR Right 2017  . CESAREAN SECTION     Family History: family history includes Alzheimer's disease in her maternal grandmother; Cancer in her paternal grandmother. Social History:  reports that she has quit smoking. She has never used smokeless tobacco. She reports that she does not drink alcohol or use drugs.     Maternal Diabetes: No Genetic Screening: Normal Maternal Ultrasounds/Referrals: Normal Fetal Ultrasounds or other Referrals:  None Maternal Substance Abuse:  No Significant Maternal Medications:  None Significant Maternal Lab Results:  Lab values include: Group B Strep negative Other Comments:  None  Review of Systems  All other systems reviewed and are negative.  History Dilation: 1 Effacement (%): 50 Station: -1 Blood pressure 125/78, pulse 95, temperature 98.1 F (36.7 C), temperature source Oral, resp. rate 20, SpO2 96 %. Exam Physical Exam  Constitutional: She is oriented to person, place, and time. She appears well-developed and well-nourished. She appears distressed.  HENT:  Head: Atraumatic.  Eyes: EOM are normal.  Neck: Neck supple.  Cardiovascular: Regular rhythm.   Respiratory: Effort normal.  GI: Soft.  Musculoskeletal: Normal range of motion.  Neurological: She is oriented to person, place, and time.  Skin: Skin is warm.  Psychiatric: She has a normal mood and affect.    Prenatal labs: ABO, Rh:  O positive Antibody:  negative Rubella:  Immune RPR:   NR HBsAg:  neg  HIV:    NR GBS:   negative  Assessment/Plan:  Previous C/S IUP@ 37 3/7 weeks Latent phase  P) repeat C/S. Risk of surgery reviewed including infection, bleeding, poss blood transfusion and its risk, Injury to surrounding organ structures, internal scar tissue. All ? Answered. OR notified Shajuan Musso A 09/19/2017, 2:34 AM

## 2017-09-19 NOTE — Anesthesia Procedure Notes (Signed)
Spinal  Patient location during procedure: OR Start time: 09/19/2017 3:28 AM End time: 09/19/2017 3:31 AM Staffing Anesthesiologist: Leilani AbleHATCHETT, Trevelle Mcgurn Performed: anesthesiologist  Spinal Block Patient position: sitting Prep: site prepped and draped and DuraPrep Patient monitoring: heart rate, cardiac monitor, continuous pulse ox and blood pressure Approach: midline Location: L3-4 Injection technique: single-shot Needle Needle type: Pencan  Needle gauge: 24 G Needle length: 10 cm Needle insertion depth: 4 cm Assessment Sensory level: T4

## 2017-09-19 NOTE — Lactation Note (Addendum)
This note was copied from a baby's chart. Lactation Consultation Note  Patient Name: Jennifer Barrera ZOXWR'UToday's Date: 09/19/2017 Reason for consult: Initial assessment   P2, Baby 15 hours old.  1528w3d.   Mother had difficulty latching first child so she pumped and bottle fed for 6 mos. She is happy this baby is latching.   Mother states she attempted latching approx 1/2 ago and baby sucked a few times and fell asleep. Suggest hand expressing and giving baby breastmilk on a spoon.  Due to visitors in room, LC will come back to work with mother. Encouraged feeding q 3 hours and on demand. Mom made aware of O/P services, breastfeeding support groups, community resources, and our phone # for post-discharge questions.   Mother latched baby in cradle hold.  Reviewed hand expression on other breast during feeding. Intermittent swallows observed. Baby breastfed on both breasts.  Provided pillows for support. Reviewed basics, depth, cluster feeding, unlatching and feeding cues. Discussed if baby becomes sleepy at the breast, suggest mother ask to be set up with DEBP tomorrow.      Maternal Data Has patient been taught Hand Expression?: Yes Does the patient have breastfeeding experience prior to this delivery?:  (mother states she pumped and bottle fed)  Feeding    LATCH Score                   Interventions    Lactation Tools Discussed/Used     Consult Status Consult Status: Follow-up Date: 09/20/17 Follow-up type: In-patient    Dahlia ByesBerkelhammer, Alieyah Spader [facility] 09/19/2017, 7:50 PM

## 2017-09-19 NOTE — Transfer of Care (Signed)
Immediate Anesthesia Transfer of Care Note  Patient: Jennifer Barrera  Procedure(s) Performed: CESAREAN SECTION (N/A )  Patient Location: PACU  Anesthesia Type:Spinal  Level of Consciousness: awake, alert  and oriented  Airway & Oxygen Therapy: Patient Spontanous Breathing  Post-op Assessment: Report given to RN and Post -op Vital signs reviewed and stable  Post vital signs: Reviewed and stable BP 135/69, HR 80, RR 84, SaO2 99%  Last Vitals:  Vitals:   09/19/17 0230 09/19/17 0231  BP:  111/77  Pulse:  100  Resp:    Temp:    SpO2: 95%     Last Pain:  Vitals:   09/19/17 0147  TempSrc: Oral  PainSc: 8          Complications: No apparent anesthesia complications

## 2017-09-19 NOTE — Anesthesia Postprocedure Evaluation (Signed)
Anesthesia Post Note  Patient: Jennifer Barrera  Procedure(s) Performed: CESAREAN SECTION (N/A )     Patient location during evaluation: Mother Baby Anesthesia Type: Spinal Level of consciousness: awake Pain management: pain level controlled Vital Signs Assessment: post-procedure vital signs reviewed and stable Respiratory status: spontaneous breathing Cardiovascular status: stable Postop Assessment: no headache, spinal receding and patient able to bend at knees Anesthetic complications: no    Last Vitals:  Vitals:   09/19/17 0545 09/19/17 0600  BP: 125/81 128/75  Pulse: 98 88  Resp: (!) 23 20  Temp:  37 C  SpO2: 97% 97%    Last Pain:  Vitals:   09/19/17 0610  TempSrc:   PainSc: 2    Pain Goal:                 Edison PaceWILKERSON,[name]

## 2017-09-20 LAB — CBC
HEMATOCRIT: 29.1 % — AB (ref 36.0–46.0)
HEMOGLOBIN: 10 g/dL — AB (ref 12.0–15.0)
MCH: 31.5 pg (ref 26.0–34.0)
MCHC: 34.4 g/dL (ref 30.0–36.0)
MCV: 91.8 fL (ref 78.0–100.0)
Platelets: 130 10*3/uL — ABNORMAL LOW (ref 150–400)
RBC: 3.17 MIL/uL — AB (ref 3.87–5.11)
RDW: 14 % (ref 11.5–15.5)
WBC: 10.3 10*3/uL (ref 4.0–10.5)

## 2017-09-20 LAB — BIRTH TISSUE RECOVERY COLLECTION (PLACENTA DONATION)

## 2017-09-20 NOTE — Progress Notes (Signed)
POD# 1  S: Pt notes pain controlled w/ po meds, minimal lochia, nl void, out of bed w/o dizziness or chest pain, tol reg po, + flatus. Pt is  breastfeeding  Vitals:   09/19/17 1635 09/19/17 2100 09/20/17 0005 09/20/17 0453  BP: (!) 106/56 106/69 107/70 102/63  Pulse: 62 61 62 64  Resp: 16 18 (!) 98 18  Temp: 98.5 F (36.9 C) 98.6 F (37 C)  98.2 F (36.8 C)  TempSrc: Oral     SpO2: 97% 100%  98%  Weight:      Height:        Gen: well appearing CV: RRR Pulm: CTAB Abd: soft, ND, approp tender, fundus below umbilicus, NT Inc: C/D/I LE: tr edema, NT  CBC    Component Value Date/Time   WBC 10.3 09/20/2017 0615   RBC 3.17 (L) 09/20/2017 0615   HGB 10.0 (L) 09/20/2017 0615   HCT 29.1 (L) 09/20/2017 0615   PLT 130 (L) 09/20/2017 0615   MCV 91.8 09/20/2017 0615   MCH 31.5 09/20/2017 0615   MCHC 34.4 09/20/2017 0615   RDW 14.0 09/20/2017 0615    A/P: POD#  1 s/p RCS - post-op. Doing well. Advance diet and ambultion, po pain meds as needed - Mild anemia, may treat with dietary supplementation as long as remains asymptomatic  Jennifer Barrera A. 09/20/2017 7:49 AM

## 2017-09-20 NOTE — Lactation Note (Signed)
This note was copied from a baby's chart. Lactation Consultation Note: follow up for feeding assessment. Infant is 2232 hours old and has had multiple feedings with latch score of 8, Mother reports that infant sometimes latches and she feels pain.  Encouraged skin to skin when feeding.  Assist mother with hand expression. Observed a few tiny drops of colostrum on the rt nipple.  Advised mother to practice frequent hand expression and breast massage. Assist mother with latching infant on in cross cradle hold. Mother taught to latch infant nipple to nose with off sided latch.  Infant latched and sustained latch for 20-25 mins. Observed short burst of suckling and a few swallows.  Mother using pacifier when I arrived in the room. Suggested that she limit use of pacifier. Mother to observed for feeding cues and feed infant at least 8-12 times in 24 hours. Discussed use of DEBP if needed. Mother reports that she used a harmony hand pump with her last child. Advised mother to post pump to stimulate milk volume. Mother ask about use of formula. Discussed use of formula if mother unable to express colostrum and infant not swallowing at breast.   Patient Name: Jennifer Barrera ZOXWR'UToday's Date: 09/20/2017 Reason for consult: Follow-up assessment   Maternal Data    Feeding Feeding Type: Breast Fed Length of feed: 20 min (on and off with wide open mouth, good depth)  LATCH Score Latch:  (feedings this shift were not observed by RN)  Audible Swallowing: A few with stimulation  Type of Nipple: Everted at rest and after stimulation  Comfort (Breast/Nipple): Soft / non-tender (complaints that a few latches have been painful)  Hold (Positioning): Assistance needed to correctly position infant at breast and maintain latch.  LATCH Score: 8  Interventions Interventions: Assisted with latch;Hand express;Breast compression;Adjust position;Support pillows;Position options  Lactation Tools Discussed/Used      Consult Status Consult Status: Follow-up Date: 09/21/17 Follow-up type: In-patient    Stevan BornKendrick, Riyan Gavina [facility] 09/20/2017, 2:25 PM

## 2017-09-20 NOTE — Op Note (Signed)
NAME:  Jennifer Barrera, Jennifer Barrera                ACCOUNT NO.:  [id number]  MEDICAL RECORD NO.:  [id number]  LOCATION:                                 FACILITY:  PHYSICIAN:  Maxie Better, M.D.    DATE OF BIRTH:  DATE OF PROCEDURE:  09/19/2017 DATE OF DISCHARGE:                              OPERATIVE REPORT   PREOPERATIVE DIAGNOSES: 1. Latent labor. 2. Previous cesarean section. 3. Intrauterine gestation at 37-3/7th weeks.  PROCEDURES: 1. Repeat cesarean section, Kerr hysterotomy.  POSTOPERATIVE DIAGNOSES: 1. Latent labor. 2. Previous cesarean section. 3. Intrauterine gestation at 37-3/7th weeks.  ANESTHESIA:  Spinal.  SURGEON:  Maxie Better, M.D.  ASSISTANT:  Arlan Organ, CNM  DESCRIPTION OF PROCEDURE:  Under adequate spinal anesthesia, the patient was placed in the supine position with a left lateral tilt.  She was sterilely prepped and draped in usual fashion.  An indwelling Foley catheter was sterilely placed.  Marcaine 0.25% was injected along the previous Pfannenstiel skin incision site.  Pfannenstiel skin incision was then made, carried down to the rectus fascia.  Rectus fascia opened transversely.  The rectus fascia was then bluntly and sharply dissected off the rectus muscle in superior and inferior fashion.  The rectus muscles split in the midline.  The parietal peritoneum was entered sharply and extended.  A self-retaining Alexis retractor was then placed.  The bladder had some adhesions from previous surgery.  Careful opening of the vesicouterine peritoneum was then performed, but the bladder was displaced inferiorly with gentle dissection.  A curvilinear low transverse uterine incision was then made and extended with bandage scissors.  Artificial rupture of membranes was done.  Clear amniotic fluid was noted.  Subsequent delivery of a live female from the left occiput transverse position was accomplished.  Baby was bulb suctioned on the abdomen.  Cord was  clamped after a minute and Apgars were assigned of 9 and 9 at one and five minutes.  The placenta was manually removed.  Uterine cavity was cleaned of debris.  Uterine incision was then closed in 2 layers, the first layer was 0 Monocryl running lock stitch, second layer was imbricating 0 Monocryl suture.  Normal tubes and ovaries were noted bilaterally.  The abdomen was then irrigated and suctioned of debris.  The Interceed was placed in the lower uterine segment in an inverted T fashion.  The Alexis retractor was then removed.  The parietal peritoneum was then closed with 2-0 Vicryl.  The rectus fascia was closed with 0 Vicryl x2.  The subcutaneous area was irrigated, small bleeders cauterized.  Interrupted 2-0 plain sutures placed.  4-0 Vicryl subcuticular closure was then done.  Benzoin and Steri-Strips were then placed.  SPECIMEN:  Placenta not sent to Pathology.  ESTIMATED BLOOD LOSS:  800 mL.  INTRAOPERATIVE FLUIDS:  2300 mL.  URINE OUTPUT:  100 mL of clear yellow urine.  Sponge and instrument counts x2 were correct.  COMPLICATIONS:  None.  The patient tolerated the procedure well, was transferred to the recovery room in stable condition.     Maxie Better, M.D.   ______________________________ Maxie Better, M.D.    [place]/[facility]  D:  09/19/2017  T:  09/20/2017  Job:  [id number]

## 2017-09-20 NOTE — Progress Notes (Signed)
LEAD teaching done with pt. DEBP set up and explained with pt. Pt understood all teaching and still requested supplementation.

## 2017-09-21 NOTE — Progress Notes (Signed)
Subjective: POD# 2 Information for the patient's newborn:  Jennifer Barrera, Jennifer Barrera [[id number]][[id number]]  female     Baby name: Emme  Reports feeling tired, little sleep in hospital, desires DC if baby bili wnl. Feeding: breast and bottle Patient reports tolerating PO.  Breast symptoms:none Pain controlled withibuprofen (OTC) and rare paercocet. Denies HA/SOB/C/P/N/V/dizziness. Flatus present. She reports vaginal bleeding as normal, without clots.  She is ambulating, urinating without difficulty.     Objective:   VS:    Vitals:   09/20/17 0005 09/20/17 0453 09/20/17 1828 09/21/17 0556  BP: 107/70 102/63 128/78 117/70  Pulse: 62 64 91 84  Resp: (!) 98 18 18 18   Temp:  98.2 F (36.8 C) 98.4 F (36.9 C) 98.5 F (36.9 C)  TempSrc:   Oral Oral  SpO2:  98%    Weight:      Height:        No intake or output data in the 24 hours ending 09/21/17 0856      Recent Labs  09/19/17 0244 09/20/17 0615  WBC 14.2* 10.3  HGB 12.4 10.0*  HCT 36.9 29.1*  PLT 183 130*     Blood type: --/--/O POS, O POS (11/01 0244)  Rubella:       Physical Exam:  General: alert, cooperative and no distress Abdomen: soft, nontender, normal bowel sounds Incision: clean, dry and intact Uterine Fundus: firm, below umbilicus, nontender Lochia: minimal Ext: no edema, redness or tenderness in the calves or thighs      Assessment/Plan: 36 y.o.   POD# 2. [id number]                  Principal Problem:   Cesarean delivery delivered: elective repeat 11/1 Active Problems:   Postpartum care following cesarean delivery (11/1)   Doing well, stable.               Advance diet as tolerated Encourage rest when baby rests Breastfeeding support Encourage to ambulate Routine post-op care May DC home this afternoon if peds DC baby  Cipriano MileDaniela C Lenny Bouchillon, CNM, MSN 09/21/2017, 8:56 AM

## 2017-09-21 NOTE — Lactation Note (Signed)
This note was copied from a baby's chart. Lactation Consultation Note  Patient Name: Jennifer Barrera Date: 09/21/2017 Reason for consult: Follow-up assessment;Early term 37-38.6wks;Hyperbilirubinemia  Baby 59 hours old. Mom reports that she was able to pump 10 ml of EBM and gave to baby after baby had been nursing off-and-on for a long time. Enc mom to put baby to breast with cues and at least by 3 hours, then supplement with EBM/formula according to supplementation guidelines--which were given with review. Enc mom to post-pump afterwards followed by hand expression. Mom given syringe and reviewed syringe and finger-feeding. Discussed progression of milk coming to volume and supply and demand. Enc mom to call for assistance as needed.   Maternal Data    Feeding Feeding Type: Breast Milk  LATCH Score                   Interventions    Lactation Tools Discussed/Used     Consult Status Consult Status: Follow-up Date: 09/22/17 Follow-up type: In-patient    [name] 09/21/2017, 3:03 PM

## 2017-09-22 MED ORDER — SENNOSIDES-DOCUSATE SODIUM 8.6-50 MG PO TABS: 2.0000 | ORAL_TABLET | ORAL | Status: AC

## 2017-09-22 MED ORDER — IBUPROFEN 600 MG PO TABS: 600.0000 mg | ORAL_TABLET | Freq: Four times a day (QID) | ORAL | 0 refills | Status: AC

## 2017-09-22 MED ORDER — SIMETHICONE 80 MG PO CHEW: 80.0000 mg | CHEWABLE_TABLET | Freq: Three times a day (TID) | ORAL | 0 refills | Status: AC

## 2017-09-22 MED ORDER — COCONUT OIL OIL: 1.0000 "application " | TOPICAL_OIL | 0 refills | Status: AC | PRN

## 2017-09-22 MED ORDER — OXYCODONE-ACETAMINOPHEN 5-325 MG PO TABS: 1.0000 | ORAL_TABLET | ORAL | 0 refills | Status: AC | PRN

## 2017-09-22 NOTE — Discharge Summary (Signed)
OB Discharge Summary     Patient Name: Jennifer HearingMelissa Barrera DOB: 08-12-1981 MRN: [id number]  Date of admission: 09/19/2017 Delivering MD: Marlayna Bannister   Date of discharge: 09/22/2017  Admitting diagnosis: 37 WEEKS CTX Intrauterine pregnancy: 2256w3d     Secondary diagnosis:  Principal Problem:   Cesarean delivery delivered: elective repeat 11/1 Active Problems:   Postpartum care following cesarean delivery (11/1)     Discharge diagnosis: Term Pregnancy Delivered and post-op day 3                                   Complications: None  Hospital course:  Sceduled C/S   36 y.o. yo G2P2002 at 2356w3d was admitted to the hospital 09/19/2017 for cesarean section with the following indication:Prior Uterine Surgery and latent labor.  Membrane Rupture Time/Date: 3:56 AM ,09/19/2017   Patient delivered a Viable infant.09/19/2017  Details of operation can be found in separate operative note.  Pateint had an uncomplicated postpartum course.  She is ambulating, tolerating a regular diet, passing flatus, and urinating well. Patient is discharged home in stable condition on  09/22/17         Physical exam  Vitals:   09/20/17 1828 09/21/17 0556 09/21/17 1806 09/22/17 0650  BP: 128/78 117/70 132/87 125/79  Pulse: 91 84 94 91  Resp: 18 18 18 18   Temp: 98.4 F (36.9 C) 98.5 F (36.9 C) 98.4 F (36.9 C) 98.6 F (37 C)  TempSrc: Oral Oral Oral Oral  SpO2:      Weight:      Height:       General: alert, cooperative and no distress Lochia: appropriate Uterine Fundus: firm Incision: Healing well with no significant drainage DVT Evaluation: No cords or calf tenderness. No significant calf/ankle edema. Labs: Lab Results  Component Value Date   WBC 10.3 09/20/2017   HGB 10.0 (L) 09/20/2017   HCT 29.1 (L) 09/20/2017   MCV 91.8 09/20/2017   PLT 130 (L) 09/20/2017   CMP Latest Ref Rng & Units 09/03/2017  Glucose 65 - 99 mg/dL 94  BUN 6 - 20 mg/dL 7  Creatinine 1.190.44 - 1.471.00 mg/dL 8.290.61  Sodium  562135 - 130145 mmol/L 136  Potassium 3.5 - 5.1 mmol/L 3.9  Chloride 101 - 111 mmol/L 105  CO2 22 - 32 mmol/L 23  Calcium 8.9 - 10.3 mg/dL 8.9  Total Protein 6.5 - 8.1 g/dL 6.7  Total Bilirubin 0.3 - 1.2 mg/dL 0.4  Alkaline Phos 38 - 126 U/L 107  AST 15 - 41 U/L 23  ALT 14 - 54 U/L 18    Discharge instruction: per After Visit Summary and "Baby and Me Booklet".  After visit meds:  Allergies as of 09/22/2017   No Known Allergies     Medication List    TAKE these medications   calcium carbonate 500 MG chewable tablet Commonly known as:  TUMS - dosed in mg elemental calcium Chew 2 tablets by mouth 2 (two) times daily as needed for indigestion or heartburn.   coconut oil Oil Apply 1 application as needed topically.   ibuprofen 600 MG tablet Commonly known as:  ADVIL,MOTRIN Take 1 tablet (600 mg total) every 6 (six) hours by mouth.   oxyCODONE-acetaminophen 5-325 MG tablet Commonly known as:  PERCOCET/ROXICET Take 1 tablet every 4 (four) hours as needed by mouth (pain scale 4-7).   prenatal multivitamin Tabs tablet Take 1 tablet by mouth  daily at 12 noon.   senna-docusate 8.6-50 MG tablet Commonly known as:  Senokot-S Take 2 tablets daily by mouth. Start taking on:  09/23/2017   simethicone 80 MG chewable tablet Commonly known as:  MYLICON Chew 1 tablet (80 mg total) 3 (three) times daily after meals by mouth.       Diet: routine diet  Activity: Advance as tolerated. Pelvic rest for 6 weeks.   Outpatient follow up:6 weeks  Postpartum contraception: Not Discussed  Newborn Data: Live born female Emme Birth Weight: 6 lb 9.6 oz (2995 g) APGAR: 9, 9  Newborn Delivery   Birth date/time:  09/19/2017 03:57:00 Delivery type:  C-Section, Low Transverse C-section categorization:  Repeat     Baby Feeding: Breast Disposition:home with mother   09/22/2017 Neta Mends, CNM

## 2017-09-22 NOTE — Lactation Note (Signed)
This note was copied from a baby's chart. Lactation Consultation Note RN requested to assess breast d/t engorgement. Mom sitting up in recliner using DEBP. Discussed filling, engorgement, management, breast massage and compression for release of milk. Discussed hand free bra so mom can massage breast. Noted softening w/pumping and massage. Breast tender, knots noted. ICE given and applied after mom laid down to rest. Encouraged to pump again in 2 hours d/t milk coming in, getting ahead of engorgement.  Baby 66 hrs old on DPT. Mom pumping good amount of transitional milk, giving to baby.  Patient Name: Jennifer Barrera Today's Date: 09/22/2017     Maternal Data    Feeding    LATCH Score                   Interventions    Lactation Tools Discussed/Used     Consult Status      Charyl DancerCARVER, Seibert Keeter G 09/22/2017, 3:44 AM

## 2017-09-22 NOTE — Lactation Note (Signed)
This note was copied from a baby's chart. Lactation Consultation Note  Patient Name: Jennifer Luther HearingMelissa Barrera ZOXWR'UToday's Date: 09/22/2017 Reason for consult: Follow-up assessment;Engorgement;Hyperbilirubinemia  Baby 77 hours old. Mom reports that she has been pumping and her breasts are feeling somewhat better. Mom has EBM at bedside, and states that she is getting more from left breast than right. Mom has some knots at upper-outer aspect of right breast. Enc mom to ice for 15 minutes prior to pumping, and to massage knotty areas while pumping. Discussed how not keeping the milk flowing can impact breast milk production, and mom reports that she did produce less milk in right breast with first child--now 7. Mom states that she is offering baby EBM after she nurses, but baby is not wanting to take the additional milk well. Mom states that she is Barrera baby gulp when nursing on left breast. Enc mom to continue icing and post-pumping while engorged. Enc mom to gradually stop pumping as baby able to soften her breasts. Mom aware of OP/BFSG and LC phone line assistance after D/C.   Maternal Data    Feeding Feeding Type: Breast Fed Length of feed: 20 min  LATCH Score                   Interventions    Lactation Tools Discussed/Used     Consult Status Consult Status: PRN    Sherlyn HayJennifer D Tayshun Gappa 09/22/2017, 9:24 AM

## 2017-09-30 ENCOUNTER — Encounter (HOSPITAL_COMMUNITY): Admission: RE | Admit: 2017-09-30 | Payer: BLUE CROSS/BLUE SHIELD | Source: Ambulatory Visit

## 2017-10-01 ENCOUNTER — Inpatient Hospital Stay (HOSPITAL_COMMUNITY)
Admission: AD | Admit: 2017-10-01 | Payer: BLUE CROSS/BLUE SHIELD | Source: Ambulatory Visit | Admitting: Obstetrics and Gynecology

## 2017-10-01 SURGERY — Surgical Case
Anesthesia: Spinal

## 2017-12-03 DIAGNOSIS — Z130 Encounter for screening for diseases of the blood and blood-forming organs and certain disorders involving the immune mechanism: Secondary | ICD-10-CM | POA: Diagnosis not present

## 2018-08-19 DIAGNOSIS — F410 Panic disorder [episodic paroxysmal anxiety] without agoraphobia: Secondary | ICD-10-CM | POA: Diagnosis not present

## 2018-08-19 DIAGNOSIS — Z23 Encounter for immunization: Secondary | ICD-10-CM | POA: Diagnosis not present

## 2018-08-19 DIAGNOSIS — Z1331 Encounter for screening for depression: Secondary | ICD-10-CM | POA: Diagnosis not present

## 2018-08-19 DIAGNOSIS — R030 Elevated blood-pressure reading, without diagnosis of hypertension: Secondary | ICD-10-CM | POA: Diagnosis not present

## 2018-09-22 DIAGNOSIS — I10 Essential (primary) hypertension: Secondary | ICD-10-CM | POA: Diagnosis not present

## 2018-09-22 DIAGNOSIS — Z6825 Body mass index (BMI) 25.0-25.9, adult: Secondary | ICD-10-CM | POA: Diagnosis not present

## 2018-11-10 DIAGNOSIS — B349 Viral infection, unspecified: Secondary | ICD-10-CM | POA: Diagnosis not present

## 2019-05-18 DIAGNOSIS — F410 Panic disorder [episodic paroxysmal anxiety] without agoraphobia: Secondary | ICD-10-CM | POA: Diagnosis not present

## 2019-05-18 DIAGNOSIS — F411 Generalized anxiety disorder: Secondary | ICD-10-CM | POA: Diagnosis not present

## 2019-05-18 DIAGNOSIS — Z6824 Body mass index (BMI) 24.0-24.9, adult: Secondary | ICD-10-CM | POA: Diagnosis not present

## 2019-08-14 DIAGNOSIS — Z6825 Body mass index (BMI) 25.0-25.9, adult: Secondary | ICD-10-CM | POA: Diagnosis not present

## 2019-08-14 DIAGNOSIS — Z23 Encounter for immunization: Secondary | ICD-10-CM | POA: Diagnosis not present

## 2019-08-14 DIAGNOSIS — I10 Essential (primary) hypertension: Secondary | ICD-10-CM | POA: Diagnosis not present

## 2019-08-20 IMAGING — US US MFM OB LIMITED
1 series · 12 of 22 positions shown · non-contrast
Comparison: none

[Series 1: us mfm ob limited · 22 acquisitions, 12 frames shown]
[im 1/22]
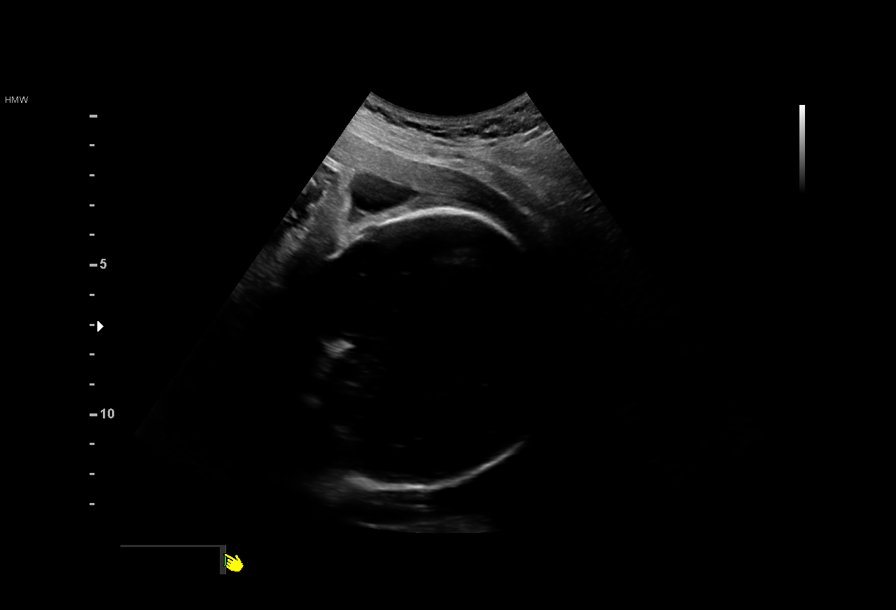
[im 3/22]
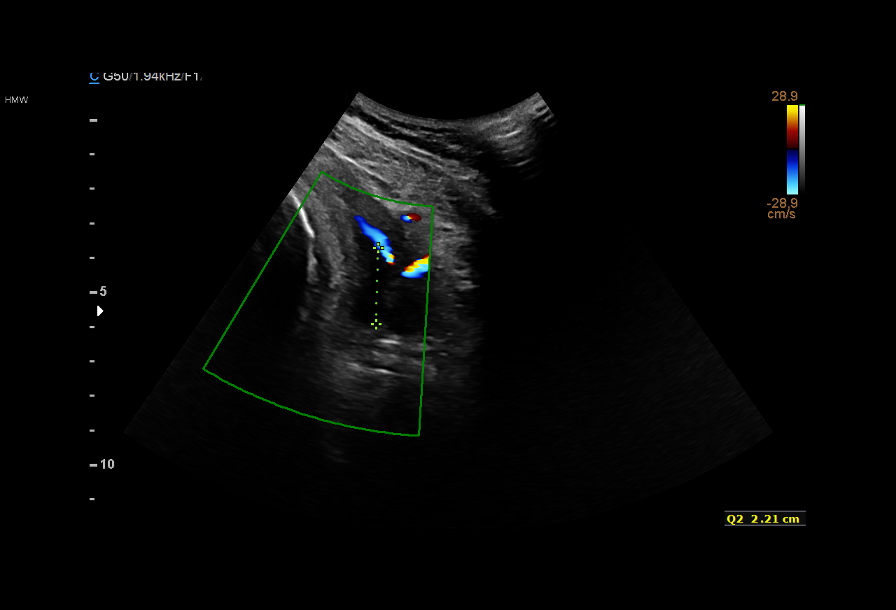
[im 5/22]
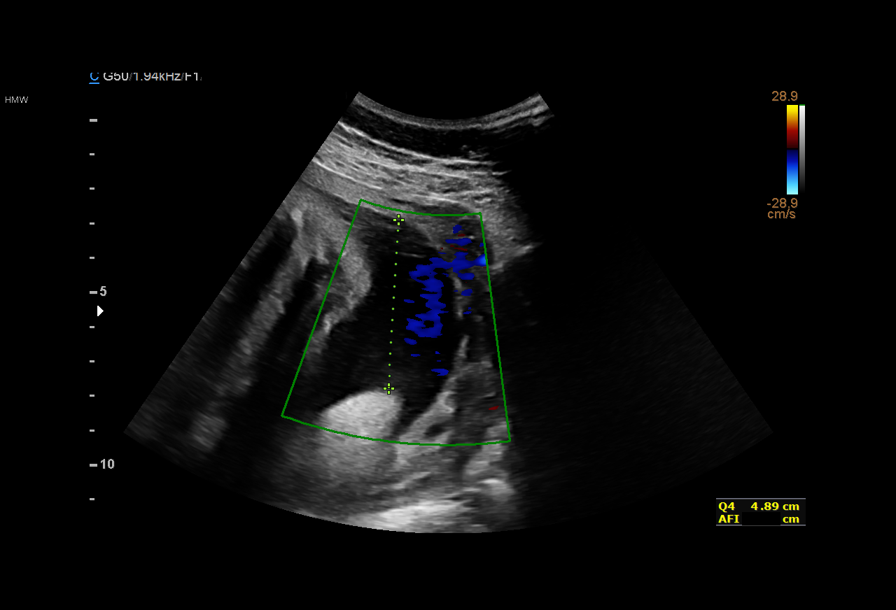
[im 7/22]
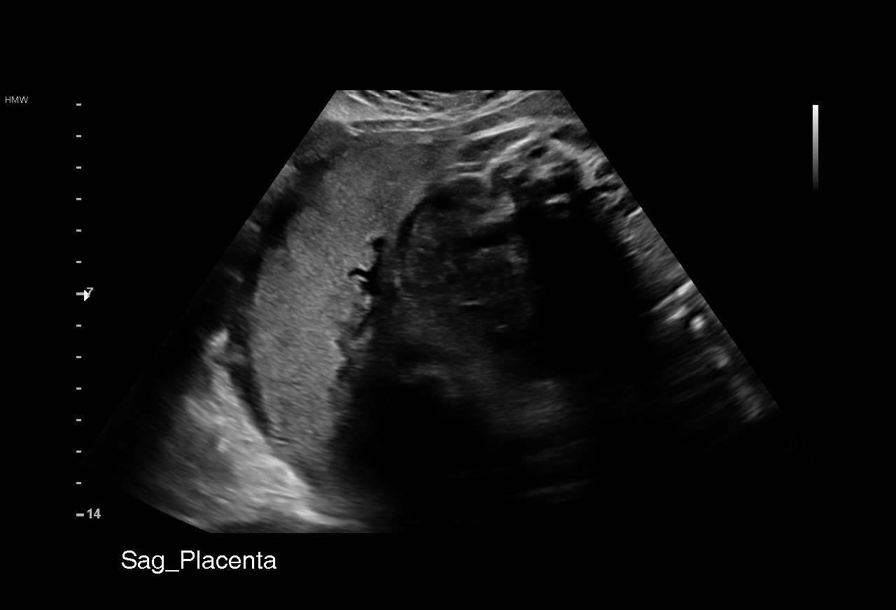
[im 9/22]
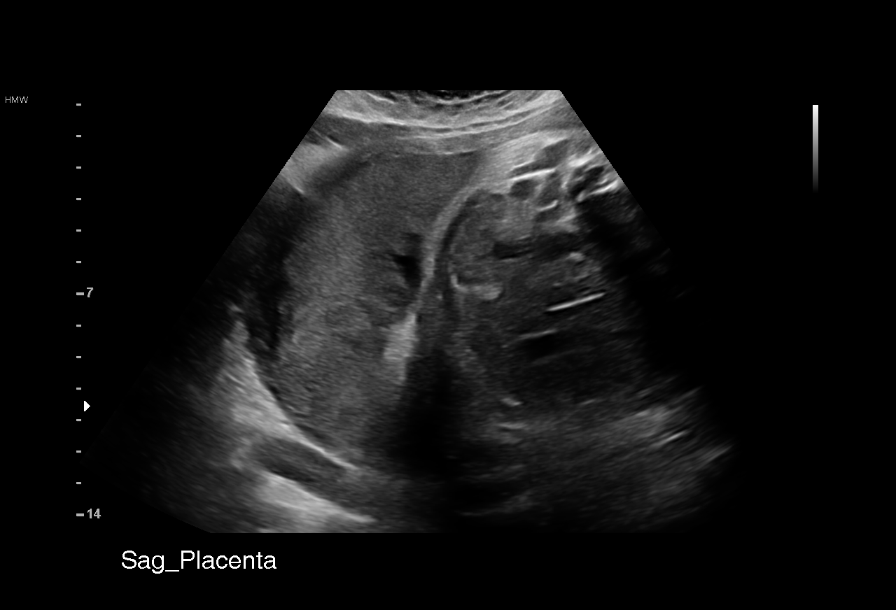
[im 11/22]
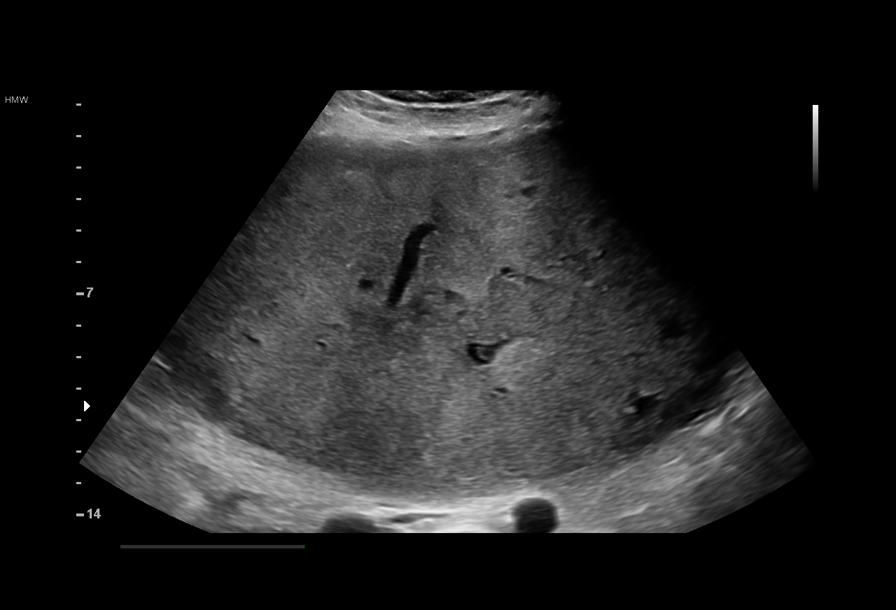
[im 12/22]
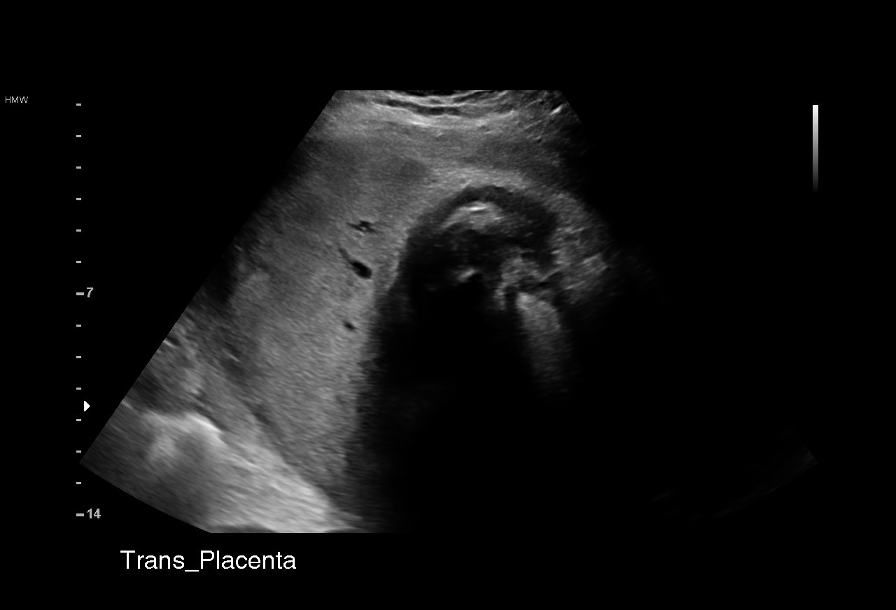
[im 14/22]
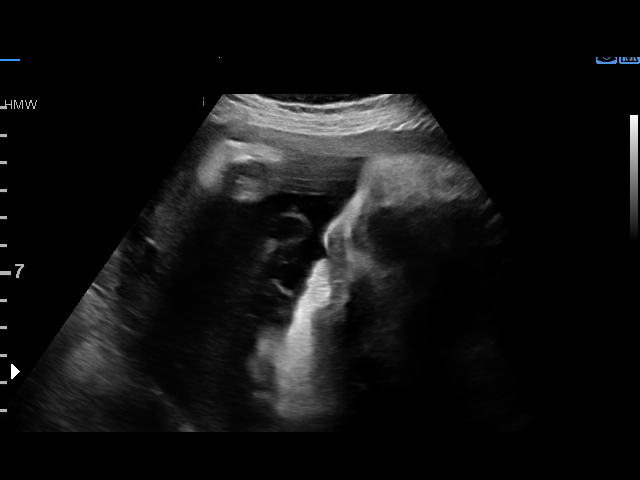
[im 16/22]
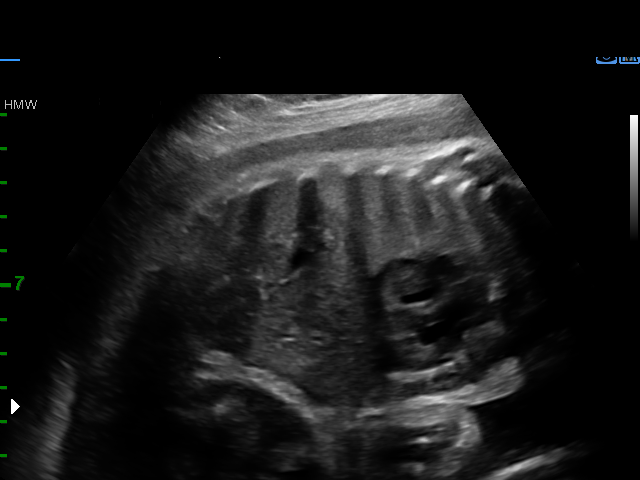
[im 18/22]
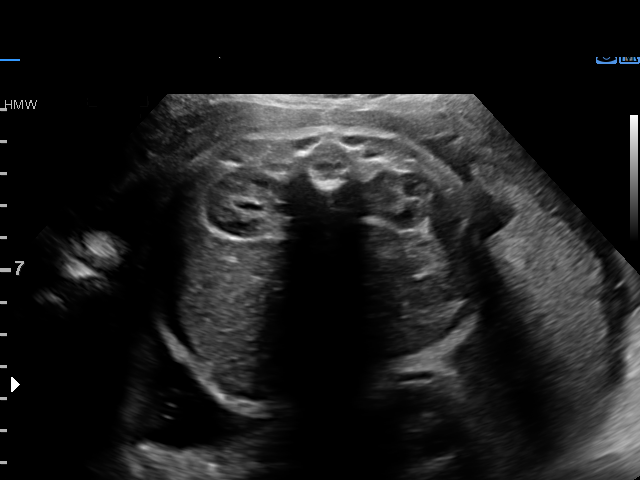
[im 20/22]
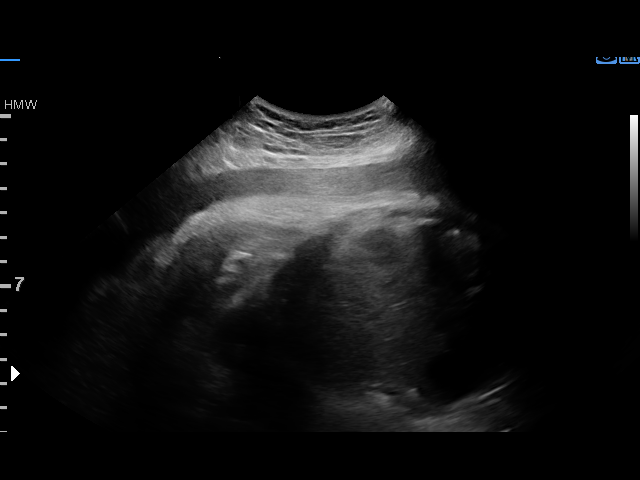
[im 22/22]
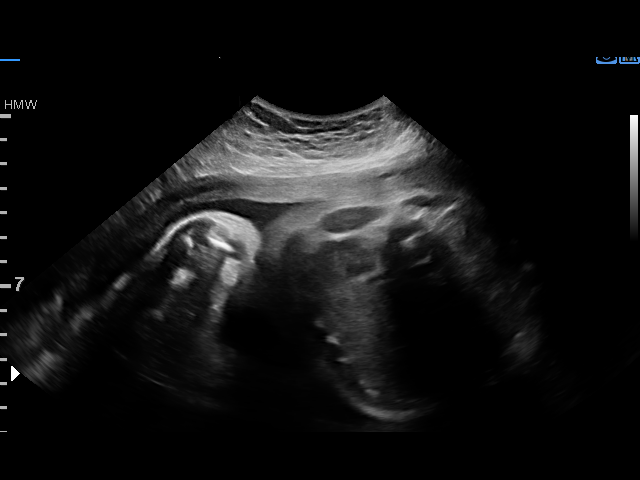

[12 of 22 positions shown; findings below may reference images not displayed]

[id number] [REDACTED]
MAU/Triage

1  ADENYO OLOGO          [id number]      [id number]     [id number]
2  ADENYO OLOGO          [id number]      [id number]     [id number]
Indications

35 weeks gestation of pregnancy
Advanced maternal age multigravida 35+,
third trimester
Vaginal bleeding in pregnancy, third trimester
Previous cesarean delivery, antepartum
OB History

Gravidity:    2         Term:   1        Prem:   0        SAB:   0
TOP:          0       Ectopic:  0        Living: 1
Fetal Evaluation

Num Of Fetuses:     1
Fetal Heart         153
Rate(bpm):
Cardiac Activity:   Observed
Presentation:       Cephalic
Placenta:           Posterior Fundal, above cervical os
P. Cord Insertion:  Visualized, central

Amniotic Fluid
AFI FV:      Subjectively within normal limits

AFI Sum(cm)     %Tile       Largest Pocket(cm)
13.27           45

RUQ(cm)       RLQ(cm)       LUQ(cm)        LLQ(cm)
3.56
Biophysical Evaluation

Amniotic F.V:   Within normal limits       F. Tone:        Observed
F. Movement:    Observed                   Score:          [DATE]
F. Breathing:   Observed
Gestational Age

Clinical EDD:  35w 1d                                        EDD:   10/07/17
Best:          35w 1d     Det. By:  Clinical EDD             EDD:   10/07/17
Cervix Uterus Adnexa

Cervix
Not visualized (advanced GA >48wks)
Impression

IUP at 35+1 with vaginal bleeding
Normal fetal movement and cardiac activity
Normal placentation without previa or markers for abruption
Normal amniotic fluid
BPP [DATE]
Recommendations

Continue clinical evaluation and management
